# Patient Record
Sex: Female | Born: 1975 | Race: Black or African American | Hispanic: No
Health system: Southern US, Community
[De-identification: ages and names within clinical notes are randomized; demographics above are authoritative.]

---

## 2002-02-08 ENCOUNTER — Encounter: Payer: Self-pay | Admitting: *Deleted

## 2002-02-08 ENCOUNTER — Ambulatory Visit (HOSPITAL_COMMUNITY): Admission: RE | Admit: 2002-02-08 | Discharge: 2002-02-08 | Payer: Self-pay | Admitting: *Deleted

## 2002-03-16 ENCOUNTER — Ambulatory Visit (HOSPITAL_COMMUNITY): Admission: RE | Admit: 2002-03-16 | Discharge: 2002-03-16 | Payer: Self-pay | Admitting: *Deleted

## 2002-03-16 ENCOUNTER — Encounter: Payer: Self-pay | Admitting: *Deleted

## 2002-03-23 ENCOUNTER — Emergency Department (HOSPITAL_COMMUNITY): Admission: EM | Admit: 2002-03-23 | Discharge: 2002-03-23 | Payer: Self-pay | Admitting: Emergency Medicine

## 2002-05-18 ENCOUNTER — Ambulatory Visit (HOSPITAL_COMMUNITY): Admission: RE | Admit: 2002-05-18 | Discharge: 2002-05-18 | Payer: Self-pay | Admitting: *Deleted

## 2002-05-27 ENCOUNTER — Encounter: Payer: Self-pay | Admitting: *Deleted

## 2002-05-27 ENCOUNTER — Ambulatory Visit (HOSPITAL_COMMUNITY): Admission: RE | Admit: 2002-05-27 | Discharge: 2002-05-27 | Payer: Self-pay | Admitting: *Deleted

## 2002-06-05 ENCOUNTER — Ambulatory Visit (HOSPITAL_COMMUNITY): Admission: AD | Admit: 2002-06-05 | Discharge: 2002-06-05 | Payer: Self-pay | Admitting: *Deleted

## 2002-06-11 ENCOUNTER — Ambulatory Visit (HOSPITAL_COMMUNITY): Admission: AD | Admit: 2002-06-11 | Discharge: 2002-06-11 | Payer: Self-pay | Admitting: *Deleted

## 2002-06-19 ENCOUNTER — Inpatient Hospital Stay (HOSPITAL_COMMUNITY): Admission: AD | Admit: 2002-06-19 | Discharge: 2002-06-21 | Payer: Self-pay | Admitting: *Deleted

## 2002-07-27 ENCOUNTER — Ambulatory Visit (HOSPITAL_COMMUNITY): Admission: RE | Admit: 2002-07-27 | Discharge: 2002-07-27 | Payer: Self-pay | Admitting: *Deleted

## 2007-11-25 ENCOUNTER — Emergency Department (HOSPITAL_COMMUNITY): Admission: EM | Admit: 2007-11-25 | Discharge: 2007-11-25 | Payer: Self-pay | Admitting: Emergency Medicine

## 2010-01-28 ENCOUNTER — Emergency Department (HOSPITAL_COMMUNITY): Admission: EM | Admit: 2010-01-28 | Discharge: 2010-01-28 | Payer: Self-pay | Admitting: Emergency Medicine

## 2010-02-08 ENCOUNTER — Ambulatory Visit (HOSPITAL_COMMUNITY): Admission: RE | Admit: 2010-02-08 | Discharge: 2010-02-08 | Payer: Self-pay | Admitting: Otolaryngology

## 2010-06-27 LAB — SURGICAL PCR SCREEN
MRSA, PCR: NEGATIVE
Staphylococcus aureus: NEGATIVE

## 2010-06-27 LAB — CBC
Hemoglobin: 11.3 g/dL — ABNORMAL LOW (ref 12.0–15.0)
MCHC: 31.1 g/dL (ref 30.0–36.0)
WBC: 8.4 10*3/uL (ref 4.0–10.5)

## 2010-08-31 NOTE — Discharge Summary (Signed)
Jacqueline Espinoza, Jacqueline Espinoza                       ACCOUNT NO.:  [id number]   MEDICAL RECORD NO.:  [id number]                   PATIENT TYPE:  INP   LOCATION:  A428                                 FACILITY:  [facility]   PHYSICIAN:  Langley Gauss, M.D.                DATE OF BIRTH:  11/08/1975   DATE OF ADMISSION:  06/18/2002  DATE OF DISCHARGE:  06/21/2002                                 DISCHARGE SUMMARY   DIAGNOSIS:  A 37-plus weeks gestation presenting in early labor.   PROCEDURE:  On June 19, 2002, placement of continuous lumbar epidural  analgesia.  June 19, 2002, vaginal delivery over a midline episiotomy.   ADDITIONAL DIAGNOSIS:  Anemia secondary to iron deficiency and blood loss.   LABORATORY DATA:  Pertinent laboratory studies include admission hemoglobin  and hematocrit of 7.8/24.5 with an MCV of 63.2, white count of 10.1.  On  postoperative day #1, hemoglobin 6.2 and hematocrit of 19.5.   DISCHARGE MEDICATIONS:  1. Hemocyte-F 1 p.o. daily, #60, no refill.  2. Tylox for pain relief.   DISPOSITION:  The patient is to follow up in the office in the next 2-3  weeks' time at which time we can schedule outpatient laparoscopic tubal  ligation for birth control purposes.   HOSPITAL COURSE:  See previous dictations.  The patient was admitted in  labor on June 18, 2002.  She did request epidural which was placed June 19, 2002, and the patient progressed to delivery also on June 19, 2002.  Postpartum, the patient did very well.  She was known to have chronic anemia  with the admission hemoglobin of 7.8.  Total estimated blood loss at the  time of delivery was less than 500 mL, thus hemoglobin of 6.2 and 19.5 was  well tolerated by the patient.  She was able to ambulate without difficulty.  No complaints of chronic headache.  She did have minimal dizziness upon  initially arising and she had no postpartum vaginal bleeding, nor was she at  risk for a delayed postpartum hemorrhage.   Thus, following ______, the  patient was prepared for discharge on June 21, 2002.  Pertinently, the  patient had been followed prenatally in our office and now states that she  has been noncompliant with her prenatal vitamins, as we could have presumed  from her MCV of 63.2.  When she was in labor, she also provided the new  history that her mother being present, her mother had strict religious  beliefs regarding transfusions, and the patient stated that she would  decline transfusion during this hospital stay.  In fact, she declined to  sign the blood consent form.   As stated previously, this had not been discussed at all prenatally, and  thus made management of second and third stage of labor to be imperative to  minimize blood loss.  The patient was, however, discharged home on June 20, 2001, and she is sternly advised that should she have an episode of vaginal  bleeding, she should contact us immediately as she does not have significant  reserve for postpartum blood loss.   Times during the hospital course:  Epidural start time June 19, 2002, at  0230.  Epidural was discontinued June 19, 2002, at 7829 for a total of 4.25  anesthesia hours.                                               Langley Gauss, M.D.    DC/MEDQ  D:  06/22/2002  T:  06/22/2002  Job:  [id number]

## 2010-08-31 NOTE — Op Note (Signed)
NAMEDAMARY, Jacqueline Espinoza                       ACCOUNT NO.:  [id number]   MEDICAL RECORD NO.:  [id number]                   PATIENT TYPE:  INP   LOCATION:  A428                                 FACILITY:  [facility]   PHYSICIAN:  Langley Gauss, M.D.                DATE OF BIRTH:  06-Dec-1975   DATE OF PROCEDURE:  06/19/2002  DATE OF DISCHARGE:                                 OPERATIVE REPORT   PROCEDURE:  Placement of continuous lumbar epidural analgesia at the L3-4  interspace with no complications performed by Dr. Lisette Grinder.   SUMMARY:  Appropriate informed consent was obtained. Continuous electronic  fetal monitoring was performed. The patient placed in the seated position,  bony landmarks were identified. The patient is noted to be morbidly obese  thus the bony landmarks are poorly palpable in the midline, thus it is  essential that the patient be placed in a seated position at which time the  best procedure is to be certain that visually the needles are placed in the  midline.   The patient is sterilely prepped and draped utilizing the epidural tray. 5  mL of 1% lidocaine plain injected at the midline of the L3-4 interspace to  raise a small skin wheel. The 17 gauge Tuohy-Schliff needle is then utilized  to enter and identify the epidural space. On the first attempt, the bony  spinous process was encountered thus the epidural needle was withdrawn. An  additional 2 mL of 1% lidocaine plain injected caudad to this first  injection site again to raise a small skin wheal. A repeat procedure is then  performed utilizing a Tuohy-Schliff needle and an air filled glass syringe.  On the second attempt, there was excellent loss of resistance consistent  with entry into the epidural space without difficulty. Initial test dose 5  mL 1.5% lidocaine plus epinephrine injected through the epidural needle, no  signs of CSF or intravascular injection obtained. The epidural catheter was  then inserted  to a depth of 5 cm, epidural needle was removed, aspiration  test is negative. A second test dose of 2 mL 1.5% lidocaine plus epinephrine  injected through the epidural catheter, again no signs of CSF or  intravascular injection obtained. Thus the epidural catheter secured into  place. The patient is connected to the infusion pump containing a standard  mixture. She will be treated with a bolus of 10 mL followed by a continuous  infusion rate of 14 mL/hour. Upon return to the lateral supine position, the  patient is noted to have evidence of a bilateral setting up epidural block.  Fetal heart rate remains reassuring. Examination immediately following the  procedure revealed the cervix to be 6 cm dilated, completely effaced and  zero station. The pelvis is noted to be clinically adequate upon  examination.  Langley Gauss, M.D.    DC/MEDQ  D:  06/19/2002  T:  06/19/2002  Job:  [id number]

## 2010-08-31 NOTE — H&P (Signed)
NAME:  Jacqueline Espinoza, Jacqueline Espinoza                       ACCOUNT NO.:  [id number]   MEDICAL RECORD NO.:  [id number]                   PATIENT TYPE:  INP   LOCATION:  A428                                 FACILITY:  [facility]   PHYSICIAN:  Langley Gauss, M.D.                DATE OF BIRTH:  [birth year]   DATE OF ADMISSION:  06/18/2002  DATE OF DISCHARGE:                                HISTORY & PHYSICAL   HISTORY OF PRESENT ILLNESS:  A 35 year old gravida 2 para 1 at 37+ weeks  gestation who presents to [facility] the p.m. of June 18, 2002 with  the chief complaint of onset of uterine contractions at 1800 with increasing  frequency and intensity since that point in time.  The patient has had  multiple previous times where she has been evaluated for onset of uterine  contractions at which time she has been discharged in false labor.  However,  on this occasion the patient states that the contractions are stronger and  more regular than on each previous occasion. The patient also provides a  history of presumably leaking fluid since 0530, very small in quantities,  making her underpants moist.  Prenatal course has been complicated by an  abnormal three-hour glucose tolerance test.  Fasting was normal at 105.  There was a late diagnosis of this occurring at [redacted] weeks gestation -  May 25, 2002 - secondary to the patient's noncompliance with the  testing protocols.  The patient has been seen by a dietician.  She has been  doing home glucose monitoring, has been only moderately compliant with the  diet.  Her two-hour postprandials by her report at home have been in the  range of 110 to 135.   OBSTETRICAL HISTORY:  Pertinent for in 1996 a vaginal delivery, 6 pound 10  ounce infant, with very rapid four-hour labor with only five pushes.  The  patient states she did have postpartum hemorrhage but did not require any  blood transfusions.   ALLERGIES:  No known drug allergies.   CURRENT  MEDICATIONS:  Prenatal vitamins and magnesium oxide p.r.n. for leg  cramps.   PHYSICAL EXAMINATION:  VITAL SIGNS:  Height 5 feet 6 inches, prepregnancy  199, recent 230.  Blood pressure 137/68, pulse 95, respiratory rate 20.  HEENT:  Reveals neck to be supple, thyroid is nonpalpable, and mucous  membranes are moist.  LUNGS:  Clear.  CARDIOVASCULAR:  Regular rate and rhythm.  ABDOMEN:  Soft and nontender and no surgical scars are identified.  Fundal  height is measured at 36 cm.  She is vertex presentation.  EXTREMITIES:  Show 2+ edema.  PELVIC:  Normal external genitalia.  No leakage of fluid or vaginal bleeding  identified.  Sterile speculum examination performed.  There is no pooling of  the amniotic fluid noted, a rather normal-appearing leukorrhea.  Digital  examination of the cervix is 4 cm dilated, 80%  effaced, -1 station, with  vertex presentation, and a bulging intact membranes palpable.  EXTERNAL FETAL MONITOR: Reveals uterine contractions every three to six  minutes.   ASSESSMENT:  1. Intrauterine pregnancy at 37+ weeks.  2. Class A2 gestational diabetes.  3. Admitted in early stages of labor.   PLAN:  Will proceed with amniotomy.  Thereafter, assess uterine contraction  pattern to determine whether augmentation with Pitocin will be clinically  indicated.                                               Langley Gauss, M.D.    DC/MEDQ  D:  06/19/2002  T:  06/19/2002  Job:  [id number]

## 2010-08-31 NOTE — H&P (Signed)
NAME:  Jacqueline Espinoza, Jacqueline Espinoza                       ACCOUNT NO.:  [id number]   MEDICAL RECORD NO.:  [id number]                   PATIENT TYPE:  AMB   LOCATION:  DAY                                  FACILITY:  [facility]   PHYSICIAN:  Langley Gauss, M.D.                DATE OF BIRTH:  10-24-1975   DATE OF ADMISSION:  07/27/2002  DATE OF DISCHARGE:                                HISTORY & PHYSICAL   HISTORY OF PRESENT ILLNESS:  The patient is a 35 year old multiparous  patient who is admitted for laparoscopic tubal ligation utilizing bipolar  cautery and in addition a 3 cm diameter granuloma will be removed from just  above the umbilicus.  The patient is adamant regarding her desire for  permanent and irreversible sterilization.  She understands and accepts that  there are other forms of birth control available but would like to proceed  with the tubal ligation.   OBSTETRICAL HISTORY:  Pertinent for vaginal delivery in 1996, also vaginal  delivery on June 19, 2002.  Most recent delivery was complicated by spinal  headaches following the epidural.  A blood patch had been placed.   ALLERGIES:  The patient states she is allergic to [facility] but presumably is  able to tolerate hydrocodone.   CURRENT MEDICATIONS:  Patient is currently taking Darvocet on a PRN basis  for complaints of persistent headache.  In addition, most recently she was  prescribed a Duragesic patch which she states did not help with the  headaches.   PHYSICAL EXAMINATION:  VITAL SIGNS:  Height is 5 feet 6 inches, weight is  203, blood pressure 129/85, pulse 65, respiratory rate 18, temperature 98.4.  HEENT:  Negative.  No adenopathy.  NECK:  Supple.  Thyroid is nonpalpable.  LUNGS:  Clear.  CARDIOVASCULAR:  Regular rate and rhythm.  ABDOMEN:  Soft and nontender.  No surgical scars are identified.  There is a  3 cm diameter granuloma just above the umbilicus.  PELVIC:  Examination reveals normal external genitalia, no  lesions or  ulcerations identified.  No vaginal bleeding.  No abnormal discharge.  Uterus is noted to be normal size, shape and consistency and nontender.  Adnexa are palpably normal.   LABORATORY DATA:  Hemoglobin 8.4, hematocrit 27.5, white count 5.4, MCV  63.9.  Patient does suffer from chronic iron deficiency anemia.  Platelet  count is 488,000.   ASSESSMENT:  Multiparous, desires permanent sterilization.   PLAN:  Proceed with laparoscopic tubal ligation utilizing bipolar cautery.  In addition patient has granuloma just above the umbilicus from previous  piercing with resultant infection.  Procedure will be performed on an  outpatient  basis on July 27, 2002.  The risks and benefits of the operative procedure  are discussed with the patient, specifically risks associated with the  laparoscopy.  The patient is likewise made aware that the granuloma will be  excised but there is a possibility  that it could recur depending on how her  healing process progresses.                                               Langley Gauss, M.D.    DC/MEDQ  D:  07/28/2002  T:  07/28/2002  Job:  [id number]

## 2010-08-31 NOTE — Discharge Summary (Signed)
   NAMECHRISTEEN, LAI                       ACCOUNT NO.:  [id number]   MEDICAL RECORD NO.:  [id number]                   PATIENT TYPE:  AMB   LOCATION:  DAY                                  FACILITY:  [facility]   PHYSICIAN:  Langley Gauss, M.D.                DATE OF BIRTH:  [birth year]   DATE OF ADMISSION:  07/27/2002  DATE OF DISCHARGE:  07/27/2002                                 DISCHARGE SUMMARY   OPERATION:  Laparoscopic tubal ligation utilizing bipolar cautery.  In  addition, granulomata supraumbilically was excised.   DISCHARGE INSTRUCTIONS:  Given copy of standard discharge instructions.   FOLLOW-UP:  In the office in one week's time.   DISCHARGE MEDICATIONS:  Lortab p.r.n.   PERTINENT LABORATORY STUDIES:  Urine hCG is negative.   HOSPITAL COURSE:  The patient underwent uncomplicated LTC with the  granulomata being excised.  She did well postoperatively, ambulated, was  able to tolerate regular general diet.  Thus, she was discharged to home on  same date of service, July 27, 2002.                                               Langley Gauss, M.D.    DC/MEDQ  D:  08/26/2002  T:  08/27/2002  Job:  [id number]

## 2010-08-31 NOTE — Op Note (Signed)
Jacqueline Espinoza, Jacqueline Espinoza                       ACCOUNT NO.:  [id number]   MEDICAL RECORD NO.:  [id number]                   PATIENT TYPE:  AMB   LOCATION:  DAY                                  FACILITY:  [facility]   PHYSICIAN:  Langley Gauss, M.D.                DATE OF BIRTH:  12-22-1975   DATE OF PROCEDURE:  07/27/2002  DATE OF DISCHARGE:                                 OPERATIVE REPORT   PREOPERATIVE DIAGNOSES:  1. Desires sterilization, multiparity.  2. 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   POSTOPERATIVE DIAGNOSES:  1. Desires sterilization, multiparity.  2. 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   PROCEDURE PERFORMED:  1. Laparoscopic tubal ligation utilizing bipolar cautery.  2. Sharp excision of granulomatous tissue.   SURGEON:  Langley Gauss, M.D.   ESTIMATED BLOOD LOSS:  Minimal.   ANESTHESIA:  General endotracheal.   SPECIMENS:  Granulomatous tissue for permanent section only.   FINDINGS AT TIME OF SURGERY:  A diffusely enlarged uterus with a very  prominent fundus.  The ovaries and tubes were noted to be normal  bilaterally.  There is very excellent uterine descensus with gentle traction  with the uterosacral ligaments descending to the hymenal ring.   DESCRIPTION OF PROCEDURE:  The patient was taken to the operating room where  her vital signs were stable.  The patient underwent an uncomplicated  induction of general endotracheal anesthesia after which time she was placed  in the low lithotomy position.  She was prepped and draped in the usual  sterile manner.  A red rubber catheter was used to drain 300 cc of clear  yellow urine from the bladder.  A speculum was then placed in the vaginal  vault, the cervix was visualized and noted to be without lesions.  A Hulka  tenaculum was then applied on the cervix.  I then changed to sterile gloves  and standing at the patient's left side, a 1-cm vertical incision was  made  just inferior to the umbilicus.  The abdominal wall was then elevated, the  Veress needle was then passed through the subumbilical incision, angling  perpendicular to the fascial plane, this was done atraumatically.  Drop test  then confirmed a proper intraperitoneal placement with no apparent bowel or  vascular injury.  Three liters of CO2 gas with a pressure of less than 16  mmHg was then insufflated.  The Veress needle was then removed and the  abdominal wall was again elevated.  The 10-mm disposable trocar and sleeve  were inserted through the subumbilical incision.  The trocar was then  removed and the scope was inserted which confirmed the proper  intraperitoneal placement.  No apparent bowel or vascular injury had  occurred.  Under direct visualization then a 5-mm trocar was placed in the  midline suprapubically, this likewise was done atraumatically without  difficulty.  This then allowed the Kleppinger bipolar cauterization forceps  to be introduced, each of the tube was traced out to its fimbriated end for  absolute confirmation of the tubal structures.  The triple cauterization  technique was then performed with the most proximal being 1 cm from the  tubal uterine junction.  Extensive tubular destruction was noted to occur  with the LED lights by toning down at 2.  Each of the three cauterizations  were done in direct continuity with the previous, resulting in about 3 cm of  tubular destruction.  After assurance of adequate tubular destruction  bilaterally, the pelvis was visualized as described previously.  The  instruments were then removed, gas was allowed to escape and the incision  sites utilizing a 2-0 Vicryl in a deep interrupted fashion through the  fascia in the subumbilical incision, followed by a 3-0 Ethilon closure of  the skin.  The suprapubic incision was likewise closed with a vertical  mattress of 3-0 Ethilon.   At this time, a sharp knife was used to  incise in an elliptical manner the 3-  cm size granuloma noted suprapubically.  This was then sent off for  permanent section only.  No significant bleeding was identified, a very  superficial excision was performed primarily involving just the epidermal  layer.  Following this, interrupted 2-0 Vicryl sutures were placed through  the Scarpa's fascia which helped reapproximate the skin edges.  The skin was  then closed utilizing 3-0 Ethilon suture in a vertical mattress fashion.  A  total of 10 cc of 0.5% bupivacaine plain was then injected in our two  incision sites to facilitate postoperative analgesia.  The procedure was  then terminated.  The patient was taken to the recovery room in stable  condition.   The operative findings were discussed with the patient's awaiting family.                                               Langley Gauss, M.D.    DC/MEDQ  D:  07/28/2002  T:  07/28/2002  Job:  [id number]

## 2010-08-31 NOTE — Op Note (Signed)
Jacqueline Espinoza, Jacqueline Espinoza                       ACCOUNT NO.:  [id number]   MEDICAL RECORD NO.:  [id number]                   PATIENT TYPE:  INP   LOCATION:  A428                                 FACILITY:  [facility]   PHYSICIAN:  Langley Gauss, M.D.                DATE OF BIRTH:  08/21/1975   DATE OF PROCEDURE:  06/18/2002  DATE OF DISCHARGE:                                 OPERATIVE REPORT   PREOPERATIVE DIAGNOSIS:  1. A 37-week intrauterine pregnancy in labor.  2. Gestational diabetes, diet controlled.   POSTOPERATIVE DIAGNOSIS:  1. A 37-week intrauterine pregnancy in labor.  2. Gestational diabetes, diet controlled.   OPERATION PERFORMED:  Placement of continuous lumbar epidural analgesia on  June 19, 2002.  Epidural start time 0230.  June 19, 2002 spontaneous  assisted vaginal delivery, 7 pound 14 ounce female infant.  June 19, 2002,  over a midline episiotomy.   SURGEON:  Langley Gauss, M.D.   ESTIMATED BLOOD LOSS:  Less than 500 cc.   ANESTHESIA:  Continuous lumbar epidural with discontinuation time 0645.  At  time of delivery 30 ml of 1% lidocaine utilized in the midline at perineal  body.   DESCRIPTION OF PROCEDURE:  The patient presented to [facility] late  p.m. June 18, 2002, was admitted in early labor on June 19, 2002.  Amniotomy  was performed when the patient was noted to be 4 cm dilated.  Thereafter,  the patient was initially treated with IV __________  for pain relief.  However, she developed itching on this and thereafter with the onset of  discomfort of uterine contractions, the patient requested epidural  analgesic.  The epidural was placed without difficulty and functioned well  throughout the remainder of the labor and delivery.  However, shortly after  placement, it was noted that the infusion pump was not infusing any volume  and thus a bolus of  5,5 mL of 2% lidocaine was utilized to initiate a good  bilaterally block.  With reaching complete  dilatation, the patient initially  did not have a strong urge to push.  She had been placed in stirrups by the  nursing staff at which time the Foley catheter had been removed.  The  epidural discontinued.  Thereafter, the patient developed a good urge to  push.  In the dorsal lithotomy position I examined her.  She was noted to be  completely dilated at a +2 station.  The patient was sterilely prepped and  draped in the usual manner.  She thereafter pushed very well with descent of  the vertex to the perineal floor with distention of the perineum.  A small  episiotomy was performed.  Excellent support of the vertex was supplied as  the patient delivered in a direct OA position over this midline episiotomy  without extension.  Mouth and nares were bulb suctioned of clear amniotomy  fluid.  Renewed expulsive efforts  resulted in spontaneous rotation to a left  anterior shoulder position.  Gentle downward traction combined with this  expulsive efforts resulted in delivery of this anterior shoulder as well as  the remainder of the infant without difficulty.  The remainder of the infant  likewise delivered without difficulty.  Spontaneous and vigorous breathing  cry was noted.  The umbilical cord was then milked towards the infant.  The  cord was doubly clamped and cut and the infant was handed to the nursing  staff for immediate assessment.  Arterial cord gas and cord blood were then  obtained.  Gentle traction on the umbilical cord resulted in separation  which upon examination appeared to be an intact three-vessel placenta.  Note, due to patient's pre-existing anemic status with hemoglobin of 7.8,  hematocrit 24.5, I performed nearly continuous fundal pressure during  separation of the placenta to achieve excellent uterine tone.  Additional  fundal massage was performed following delivery of the placenta.  Examination of genital tract reveals no additional lacerations.  Midline  episiotomy  was repaired utilizing 0 chromic in a running locked fashion on  the vaginal mucosa followed by a two-layer closure of 0 chromic on the  perineal body.  The patient tolerated the delivery very well.  She was taken  out of dorsal lithotomy position and rolled to her side at which time the  epidural catheter was removed with the blue tip noted to be intact.  Mother  and infant both doing very well following delivery.  The patient does plan  on breast feeding.                                                Langley Gauss, M.D.    DC/MEDQ  D:  06/19/2002  T:  06/19/2002  Job:  [id number]

## 2011-01-11 LAB — BASIC METABOLIC PANEL
BUN: 10
Creatinine, Ser: 0.69
GFR calc non Af Amer: >60
Potassium: 3.4 — ABNORMAL LOW

## 2011-01-11 LAB — URINE MICROSCOPIC-ADD ON

## 2011-01-11 LAB — URINALYSIS, ROUTINE W REFLEX MICROSCOPIC
Nitrite: NEGATIVE
Specific Gravity, Urine: 1.02
pH: 7

## 2011-01-11 LAB — WET PREP, GENITAL

## 2011-01-11 LAB — CBC
HCT: 33.6 — ABNORMAL LOW
Platelets: 403 — ABNORMAL HIGH
WBC: 8.7

## 2011-01-11 LAB — PREGNANCY, URINE: Preg Test, Ur: NEGATIVE

## 2011-01-11 LAB — DIFFERENTIAL
Lymphocytes Relative: 24
Lymphs Abs: 2.1
Neutrophils Relative %: 66

## 2011-05-24 ENCOUNTER — Other Ambulatory Visit: Payer: Self-pay | Admitting: Obstetrics and Gynecology

## 2011-05-24 DIAGNOSIS — N939 Abnormal uterine and vaginal bleeding, unspecified: Secondary | ICD-10-CM

## 2011-05-27 ENCOUNTER — Other Ambulatory Visit (HOSPITAL_COMMUNITY): Payer: Self-pay | Admitting: Family Medicine

## 2011-05-27 ENCOUNTER — Other Ambulatory Visit (HOSPITAL_COMMUNITY): Payer: Self-pay

## 2011-05-27 ENCOUNTER — Ambulatory Visit (HOSPITAL_COMMUNITY)
Admission: RE | Admit: 2011-05-27 | Discharge: 2011-05-27 | Disposition: A | Discharge status: Home / Self Care | Payer: Self-pay | Source: Ambulatory Visit | Attending: Obstetrics and Gynecology | Admitting: Obstetrics and Gynecology

## 2011-05-27 DIAGNOSIS — N939 Abnormal uterine and vaginal bleeding, unspecified: Secondary | ICD-10-CM

## 2011-05-27 DIAGNOSIS — D252 Subserosal leiomyoma of uterus: Secondary | ICD-10-CM | POA: Insufficient documentation

## 2011-05-27 DIAGNOSIS — N920 Excessive and frequent menstruation with regular cycle: Secondary | ICD-10-CM | POA: Insufficient documentation

## 2018-11-17 ENCOUNTER — Other Ambulatory Visit: Payer: Self-pay | Admitting: Internal Medicine

## 2018-11-17 DIAGNOSIS — Z1231 Encounter for screening mammogram for malignant neoplasm of breast: Secondary | ICD-10-CM

## 2018-12-17 ENCOUNTER — Other Ambulatory Visit: Payer: Self-pay

## 2018-12-17 ENCOUNTER — Ambulatory Visit
Admission: RE | Admit: 2018-12-17 | Discharge: 2018-12-17 | Disposition: A | Discharge status: Home / Self Care | Payer: Managed Care, Other (non HMO) | Source: Ambulatory Visit | Attending: Internal Medicine | Admitting: Internal Medicine

## 2018-12-17 DIAGNOSIS — Z1231 Encounter for screening mammogram for malignant neoplasm of breast: Secondary | ICD-10-CM

## 2018-12-22 ENCOUNTER — Other Ambulatory Visit: Payer: Self-pay | Admitting: Internal Medicine

## 2018-12-22 DIAGNOSIS — R928 Other abnormal and inconclusive findings on diagnostic imaging of breast: Secondary | ICD-10-CM

## 2018-12-25 ENCOUNTER — Other Ambulatory Visit: Payer: Self-pay

## 2018-12-25 ENCOUNTER — Ambulatory Visit
Admission: RE | Admit: 2018-12-25 | Discharge: 2018-12-25 | Disposition: A | Discharge status: Home / Self Care | Payer: Managed Care, Other (non HMO) | Source: Ambulatory Visit | Attending: Internal Medicine | Admitting: Internal Medicine

## 2018-12-25 ENCOUNTER — Other Ambulatory Visit: Payer: Self-pay | Admitting: Internal Medicine

## 2018-12-25 DIAGNOSIS — R599 Enlarged lymph nodes, unspecified: Secondary | ICD-10-CM

## 2018-12-25 DIAGNOSIS — R928 Other abnormal and inconclusive findings on diagnostic imaging of breast: Secondary | ICD-10-CM

## 2019-01-05 ENCOUNTER — Other Ambulatory Visit: Payer: Self-pay

## 2019-01-05 ENCOUNTER — Other Ambulatory Visit: Payer: Self-pay | Admitting: Body Imaging

## 2019-01-05 ENCOUNTER — Ambulatory Visit
Admission: RE | Admit: 2019-01-05 | Discharge: 2019-01-05 | Disposition: A | Discharge status: Home / Self Care | Payer: Managed Care, Other (non HMO) | Source: Ambulatory Visit | Attending: Internal Medicine | Admitting: Internal Medicine

## 2019-01-05 DIAGNOSIS — R599 Enlarged lymph nodes, unspecified: Secondary | ICD-10-CM

## 2019-01-07 LAB — SURGICAL PATHOLOGY

## 2019-06-18 ENCOUNTER — Other Ambulatory Visit: Payer: Self-pay | Admitting: Internal Medicine

## 2019-06-18 DIAGNOSIS — N6310 Unspecified lump in the right breast, unspecified quadrant: Secondary | ICD-10-CM

## 2019-06-19 ENCOUNTER — Ambulatory Visit: Payer: Managed Care, Other (non HMO) | Attending: Internal Medicine

## 2019-06-19 DIAGNOSIS — Z23 Encounter for immunization: Secondary | ICD-10-CM

## 2019-06-19 NOTE — Progress Notes (Signed)
   Covid-19 Vaccination Clinic  Name:  Jacqueline Espinoza    MRN: [id number] DOB: 1976/03/18  06/19/2019  [name] was observed post Covid-19 immunization for 15 minutes without incident. She was provided with Vaccine Information Sheet and instruction to access the V-Safe system.   [name] was instructed to call 911 with any severe reactions post vaccine: Marland Kitchen Difficulty breathing  . Swelling of face and throat  . A fast heartbeat  . A bad rash all over body  . Dizziness and weakness   Immunizations Administered    Name Date Dose VIS Date Route   Pfizer COVID-19 Vaccine 06/19/2019  1:17 PM 0.3 mL 03/26/2019 Intramuscular   Manufacturer: [place]   [place]ot: [id number]   [place]: [id number]

## 2019-07-09 ENCOUNTER — Other Ambulatory Visit: Payer: Managed Care, Other (non HMO)

## 2019-07-10 ENCOUNTER — Ambulatory Visit: Payer: Managed Care, Other (non HMO) | Attending: Internal Medicine

## 2019-07-10 DIAGNOSIS — Z23 Encounter for immunization: Secondary | ICD-10-CM

## 2019-07-10 NOTE — Progress Notes (Signed)
   Covid-19 Vaccination [facility]  Name:  Jacqueline Espinoza    MRN: [id number] DOB: [birth year]  07/10/2019  [name] was observed post Covid-19 immunization for 15 minutes without incident. She was provided with Vaccine Information Sheet and instruction to access the V-Safe system.   [name] was instructed to call 911 with any severe reactions post vaccine: Marland Kitchen Difficulty breathing  . Swelling of face and throat  . A fast heartbeat  . A bad rash all over body  . Dizziness and weakness   Immunizations Administered    Name Date Dose VIS Date Route   Pfizer COVID-19 Vaccine 07/10/2019  9:48 AM 0.3 mL 03/26/2019 Intramuscular   Manufacturer: [place]   Lot: [id number]   [place]: [id number]

## 2019-07-20 ENCOUNTER — Ambulatory Visit: Payer: Managed Care, Other (non HMO)

## 2019-12-08 IMAGING — MG MM BREAST LOCALIZATION CLIP
4 series · 4 of 12 positions shown · non-contrast
Comparison: Previous exam(s).

CLINICAL DATA: Post biopsy mammogram of the bilateral breasts for
clip placement.

EXAM:
DIAGNOSTIC BILATERAL MAMMOGRAM POST ULTRASOUND BIOPSY

[R ML synth-2D]
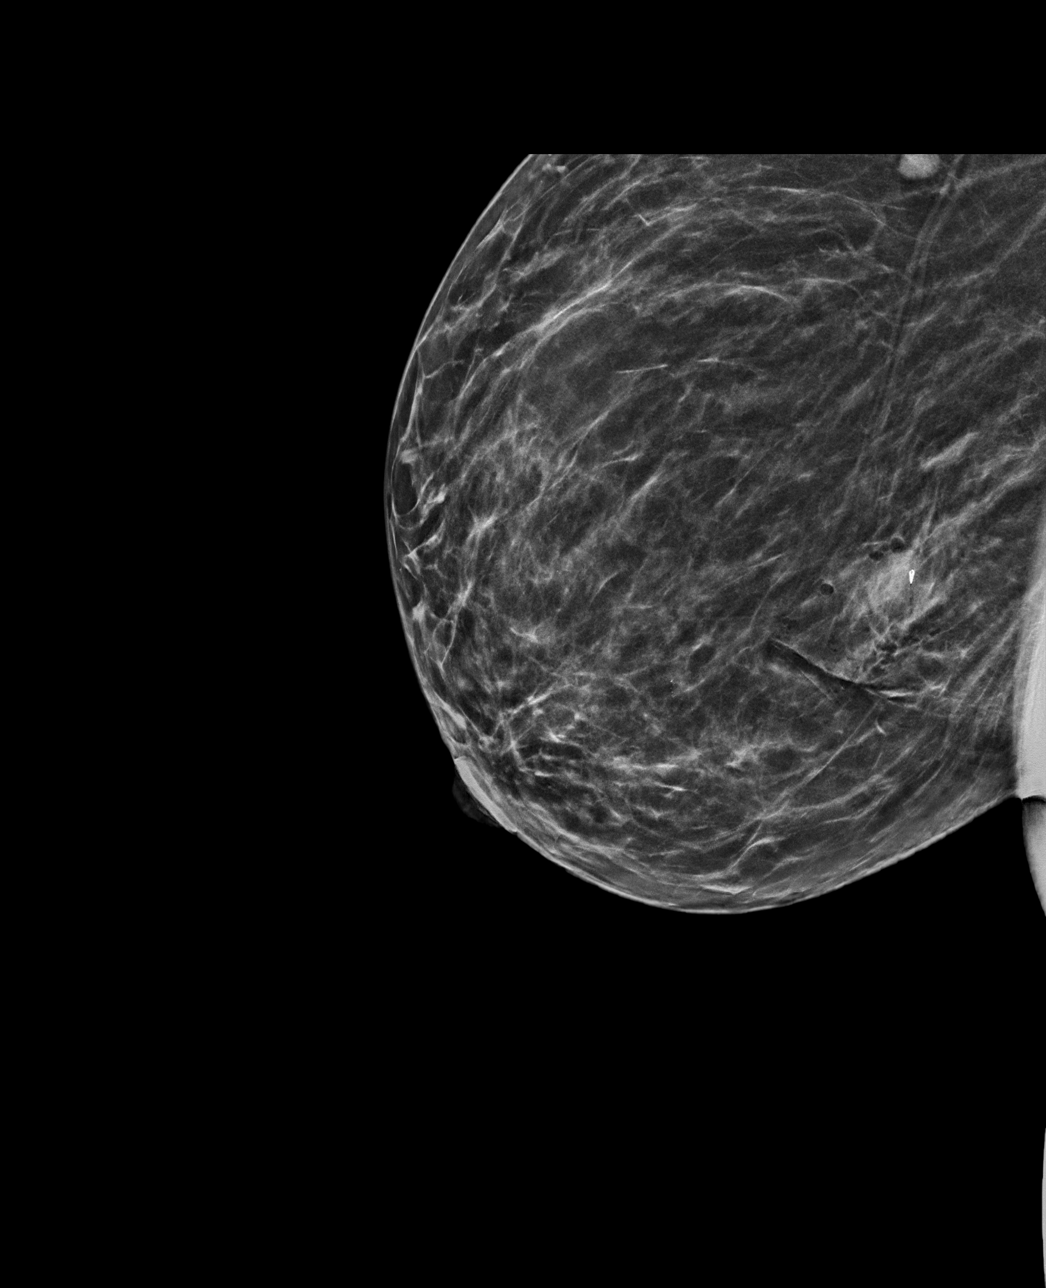

[R CC synth-2D]
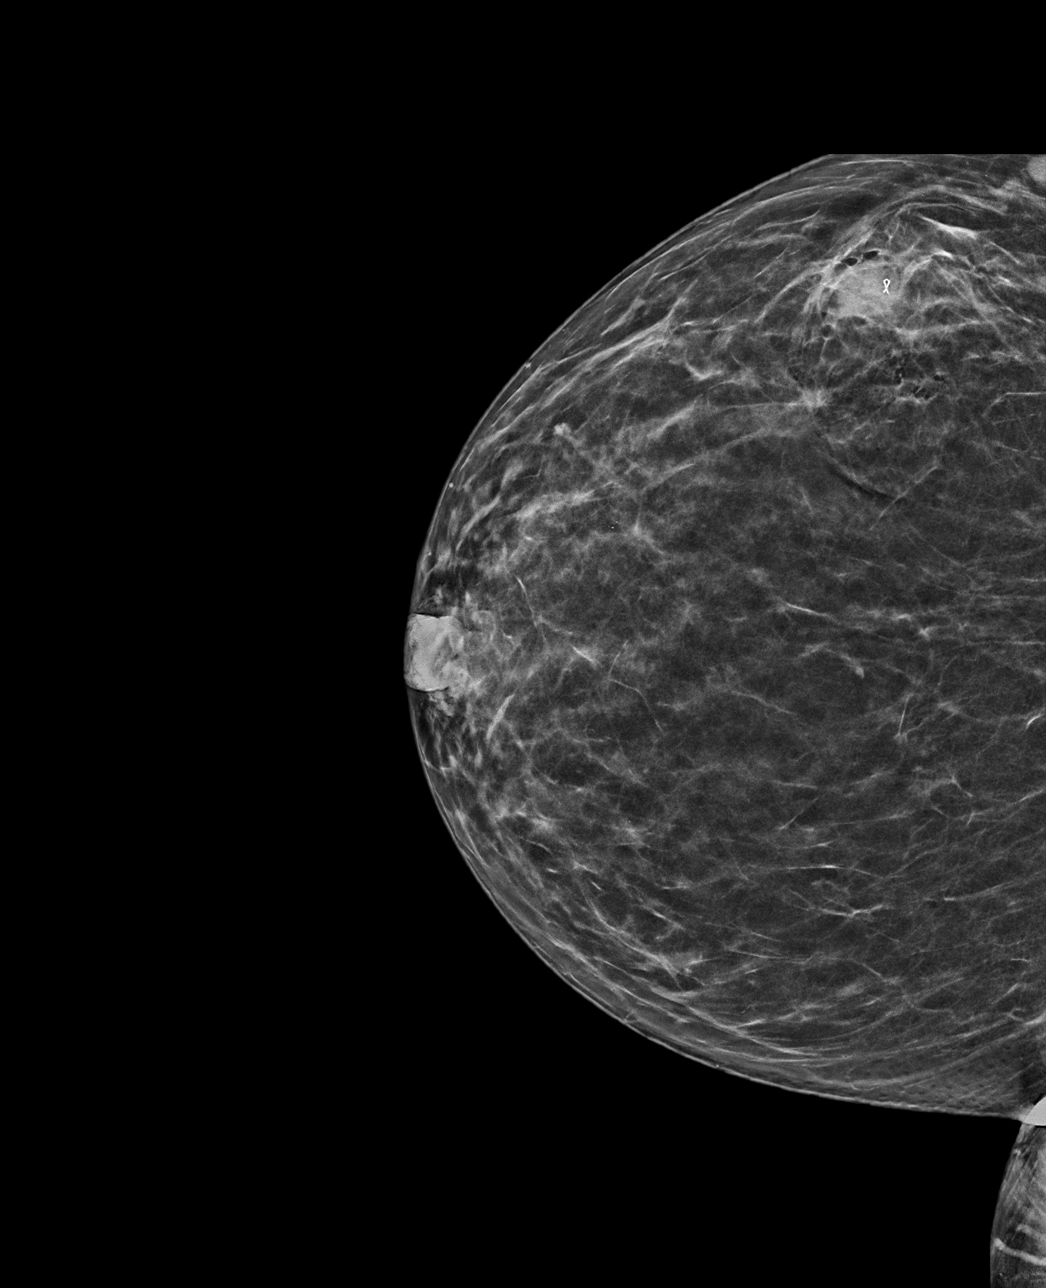

[R ML tomo · tomo slice 35/69.0]
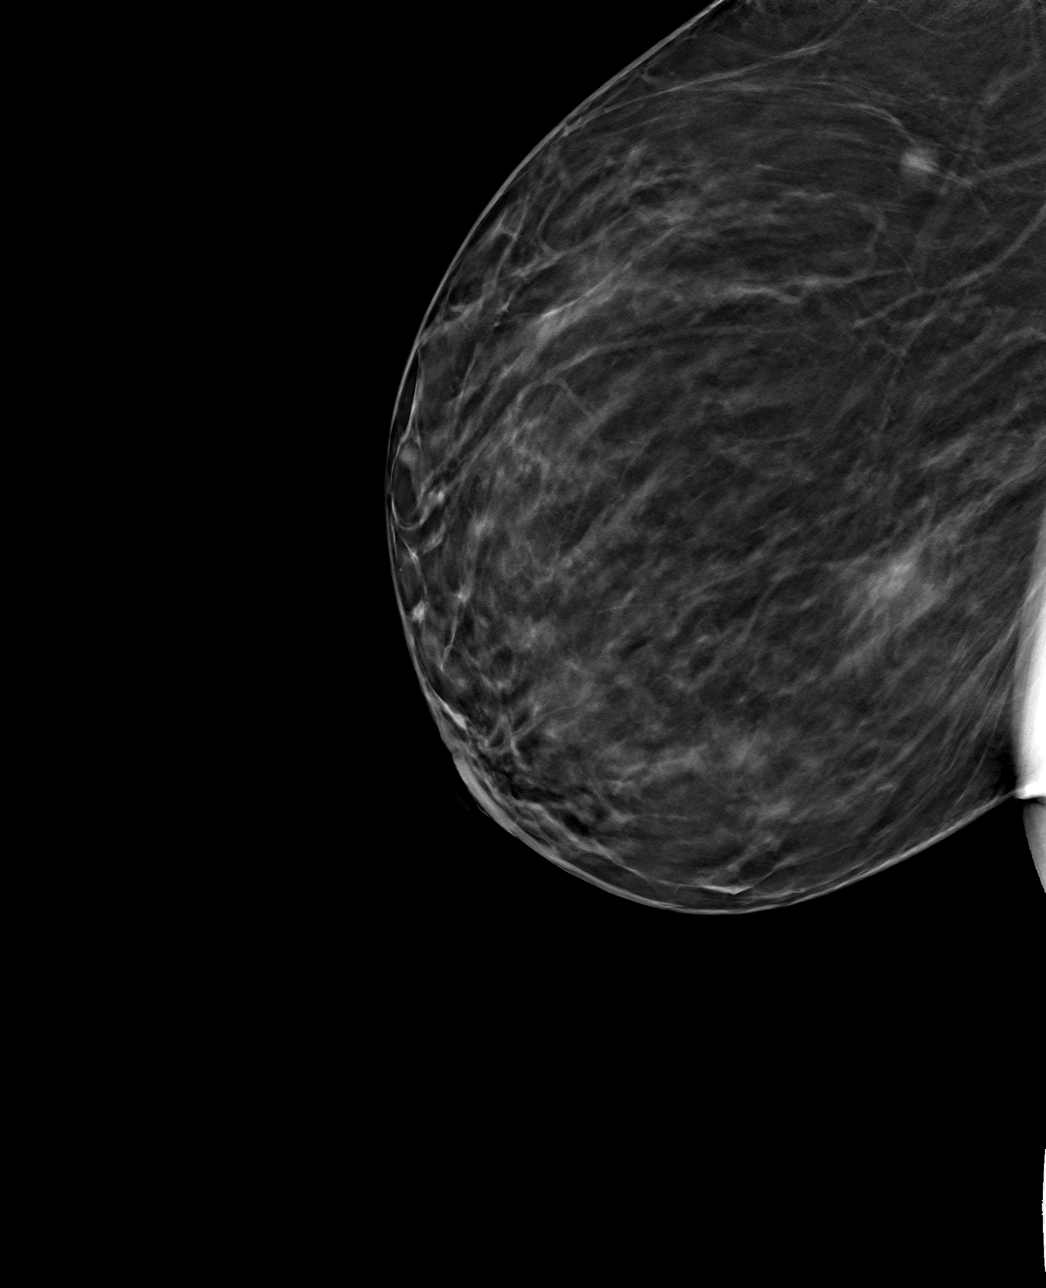

[R CC tomo · tomo slice 31/60.0]
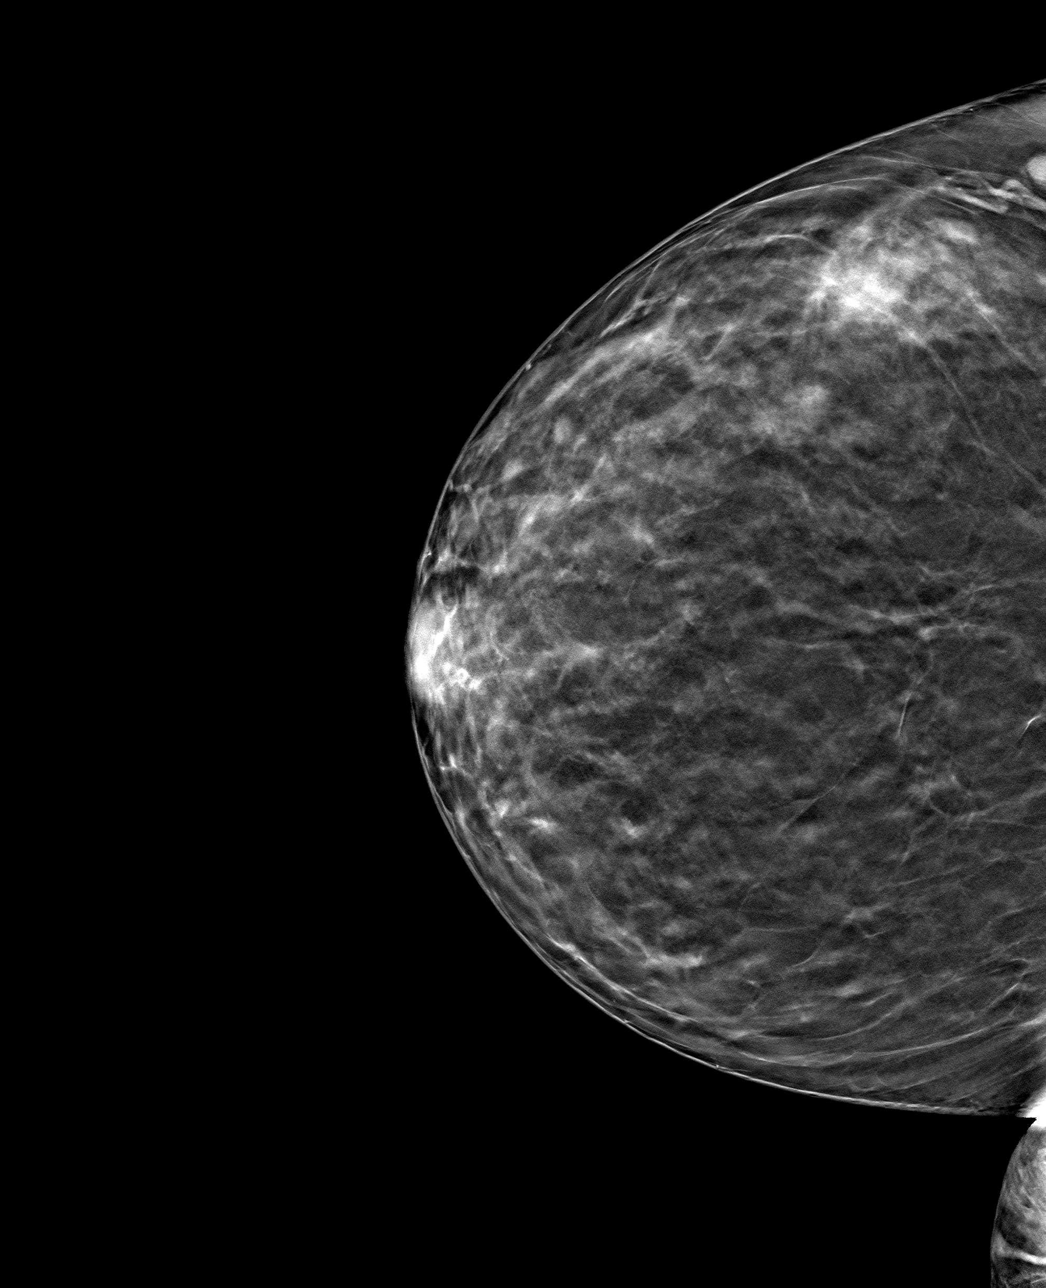

[4 of 12 positions shown; findings below may reference images not displayed]

FINDINGS: Mammographic images were obtained following ultrasound guided biopsy
of a right breast mass at 9 o'clock and a left breast mass at 2
o'clock. The ribbon shaped biopsy marking clip is well positioned
within the right breast mass at 9 o'clock. Another ribbon shaped
biopsy marking clip is well positioned within the left breast mass
at 2 o'clock.
IMPRESSION: Appropriate positioning of the bilateral ribbon shaped biopsy
marking clips.

Final Assessment: Post Procedure Mammograms for Marker Placement

## 2019-12-08 IMAGING — US US BREAST BX W LOC DEV 1ST LESION IMG BX SPEC US GUIDE*L*
1 series · 9 of 9 positions shown · non-contrast
Comparison: Previous exam(s).
COMPARISON: Previous exam(s).

Addendum:
CLINICAL DATA: 43-year-old female presenting for ultrasound-guided
biopsy of bilateral breast masses.

EXAM:
ULTRASOUND GUIDED BILATERAL BREAST CORE NEEDLE BIOPSY

[Series 1: us breast bx w loc dev 1st lesion img bx spec us g · 0.06mm/px · 9 of 9 slices shown]
[im 1/9]
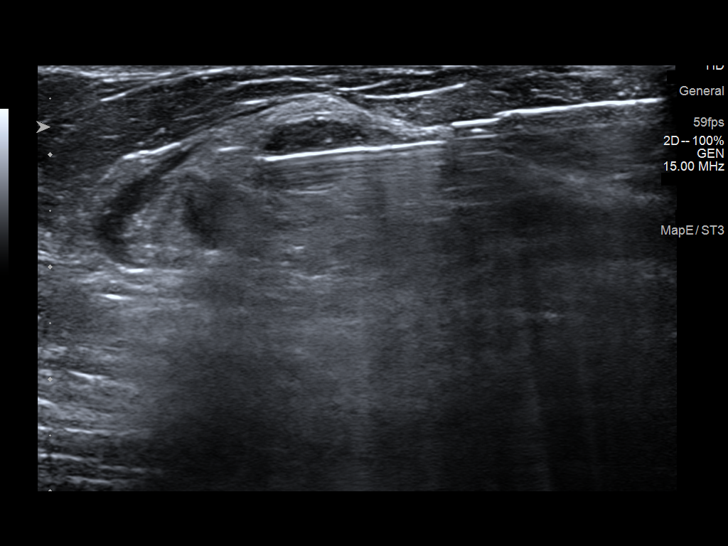
[im 2/9]
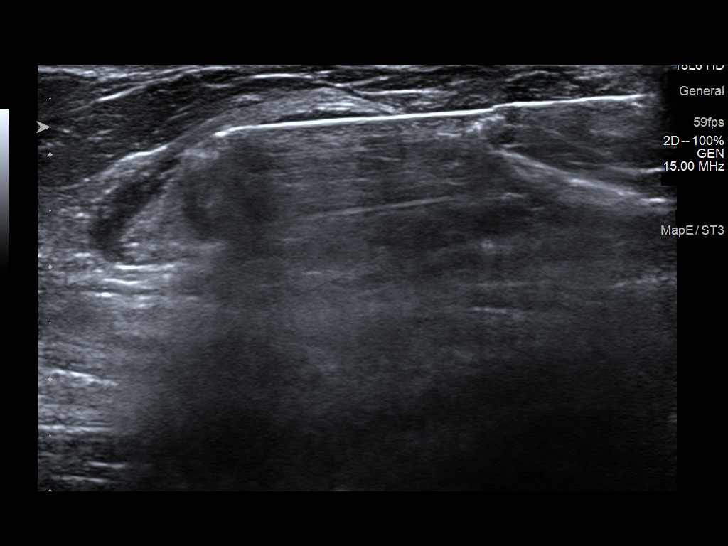
[im 3/9]
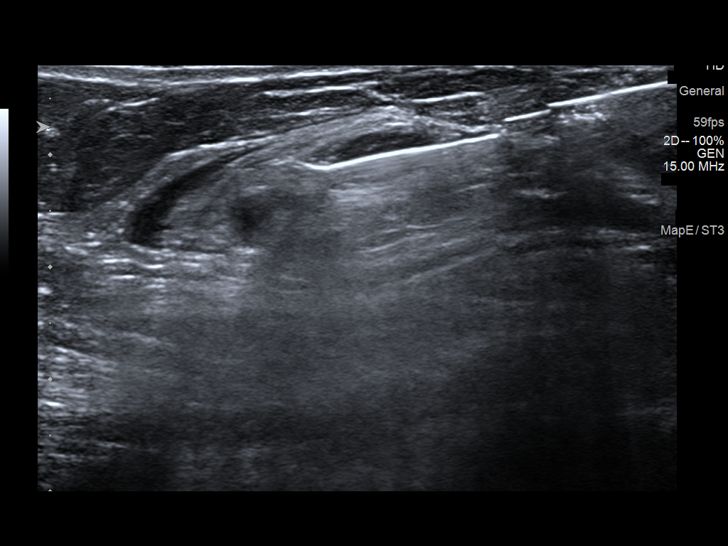
[im 4/9]
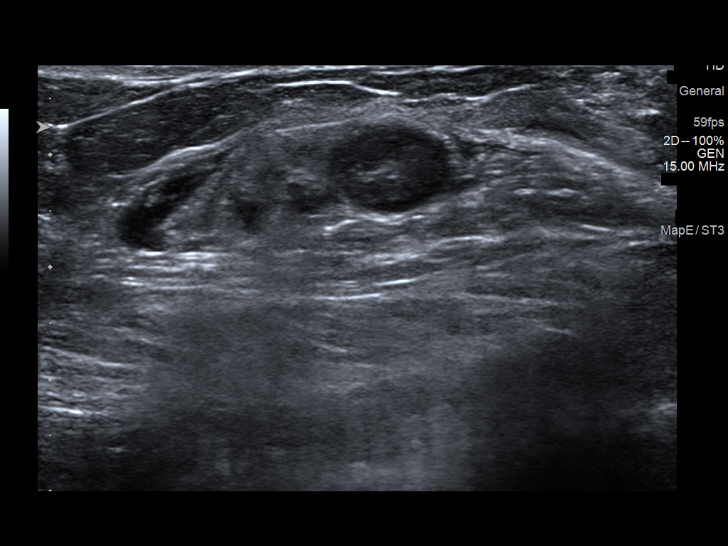
[im 5/9]
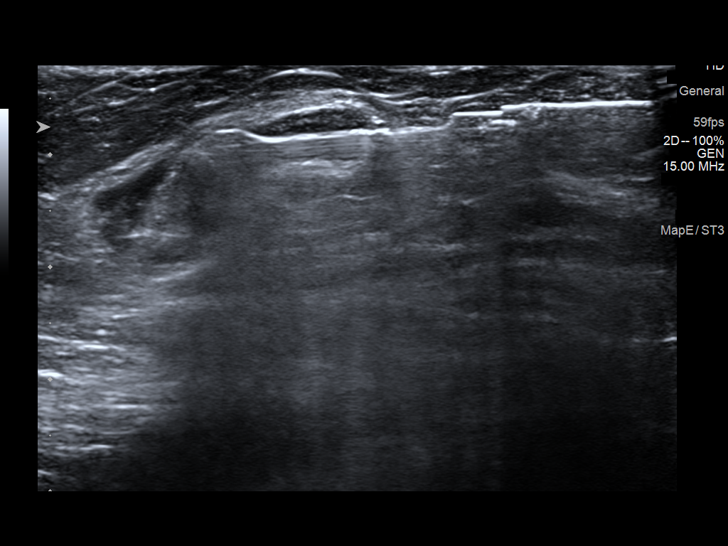
[im 6/9]
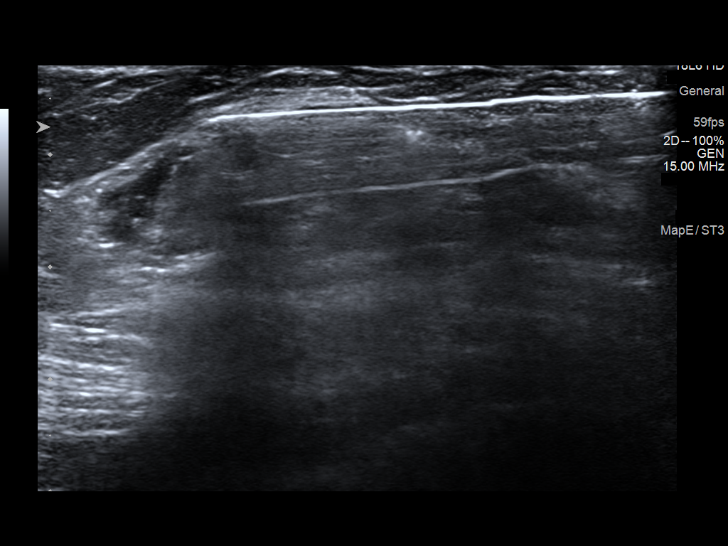
[im 7/9]
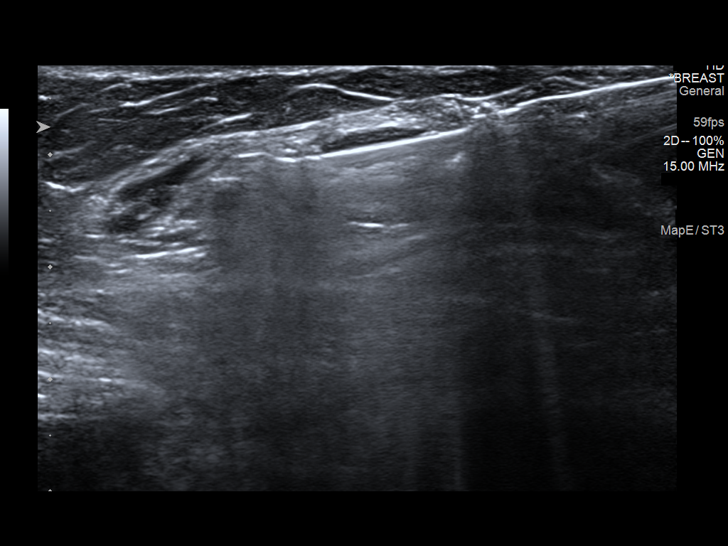
[im 8/9]
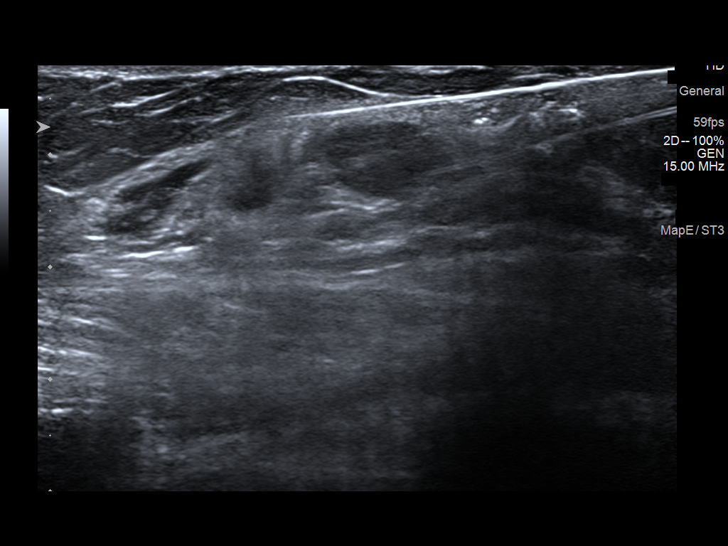
[im 9/9]
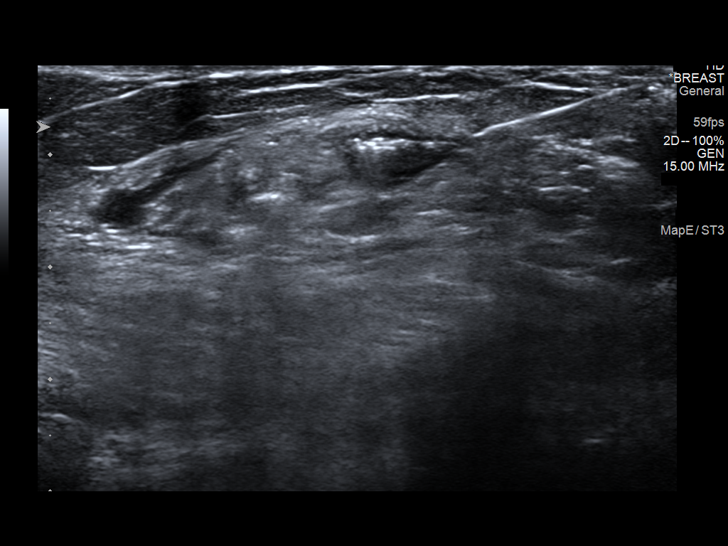

[9 of 9 positions shown; findings below may reference images not displayed]



#1 Lesion quadrant: Upper-outer quadrant

Using sterile technique and 1% Lidocaine as local anesthetic, under
direct ultrasound visualization, a 14 gauge Mike device was
used to perform biopsy of a mass in the right breast at 9 o'clock
using an inferior approach. At the conclusion of the procedure a
ribbon shaped tissue marker clip was deployed into the biopsy
cavity.

--------------------------------------------------------------------------------------------------------------------------------------------

#2 Lesion quadrant: Upper-outer quadrant

Using sterile technique and 1% Lidocaine as local anesthetic, under
direct ultrasound visualization, a 14 gauge Mike device was
used to perform biopsy of a mass in the upper-outer quadrant of the
left breast at 2 o'clock using an inferior approach. At the
conclusion of the procedure a ribbon shaped tissue marker clip was
deployed into the biopsy cavity.

Follow up 2 view mammogram was performed and dictated separately.
IMPRESSION: 1. Ultrasound guided biopsy of a right breast mass at 9 o'clock. No
apparent complications.

2. Ultrasound guided biopsy of a left breast mass at 2 o'clock. No
apparent complications.

ADDENDUM:
Pathology revealed 1- BENIGN LYMPH NODE WITH PIGMENTED HISTIOCYTES.
NO OVERT MALIGNANCY IDENTIFIED. SEE COMMENT- of the RIGHT breast, 9
o'clock. This was found to be concordant by Dr. Blain Centeno.

Pathology revealed 2- BENIGN LYMPH NODE WITH PIGMENTED HISTIOCYTES.
NO OVERT MALIGNANCY IDENTIFIED. SEE COMMENT- of the LEFT breast, 2
o'clock. This was found to be concordant by Dr. Blain Centeno.

Microscopic Comment-

1. and 2. Both biopsies are lymph node cores with relatively
preserved nodal architecture and increased pigmented histiocytes.
Clumps of pigmentation are also noted in part 1, as is a single
well-formed, noncaseating, epithelioid granuloma.

Pathology results were discussed with the patient and Dr. Mertens by
telephone. The patient reported doing well after the biopsies with
tenderness at the sites. Post biopsy instructions and care were
reviewed and questions were answered. The patient was encouraged to
call The [REDACTED] for any additional
concerns.

The patient was instructed to return for right breast ultrasound in
6 months. She was informed a reminder notice would be sent regarding
this appointment. Of note, the patient diagnosed with rheumatoid
arthritis in 0949.

Pathology results reported by Rego Tripicchio, RN on 01/08/2019.



#1 Lesion quadrant: Upper-outer quadrant

Using sterile technique and 1% Lidocaine as local anesthetic, under
direct ultrasound visualization, a 14 gauge Mike device was
used to perform biopsy of a mass in the right breast at 9 o'clock
using an inferior approach. At the conclusion of the procedure a
ribbon shaped tissue marker clip was deployed into the biopsy
cavity.

--------------------------------------------------------------------------------------------------------------------------------------------

#2 Lesion quadrant: Upper-outer quadrant

Using sterile technique and 1% Lidocaine as local anesthetic, under
direct ultrasound visualization, a 14 gauge Mike device was
used to perform biopsy of a mass in the upper-outer quadrant of the
left breast at 2 o'clock using an inferior approach. At the
conclusion of the procedure a ribbon shaped tissue marker clip was
deployed into the biopsy cavity.

Follow up 2 view mammogram was performed and dictated separately.
IMPRESSION: 1. Ultrasound guided biopsy of a right breast mass at 9 o'clock. No
apparent complications.

2. Ultrasound guided biopsy of a left breast mass at 2 o'clock. No
apparent complications.
